# Patient Record
Sex: Female | Born: 2000 | Race: Black or African American | Hispanic: No | Marital: Single | Smoking: Never smoker
Health system: Southern US, Community
[De-identification: ages and names within clinical notes are randomized; demographics above are authoritative.]

## PROBLEM LIST (undated history)

## (undated) DIAGNOSIS — F419 Anxiety disorder, unspecified: Secondary | ICD-10-CM

## (undated) DIAGNOSIS — J45909 Unspecified asthma, uncomplicated: Secondary | ICD-10-CM

## (undated) DIAGNOSIS — F429 Obsessive-compulsive disorder, unspecified: Secondary | ICD-10-CM

## (undated) DIAGNOSIS — L309 Dermatitis, unspecified: Secondary | ICD-10-CM

## (undated) HISTORY — DX: Dermatitis, unspecified: L30.9

---
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
## 2019-01-10 DIAGNOSIS — F419 Anxiety disorder, unspecified: Secondary | ICD-10-CM | POA: Diagnosis not present

## 2019-01-10 DIAGNOSIS — F429 Obsessive-compulsive disorder, unspecified: Secondary | ICD-10-CM | POA: Diagnosis not present

## 2019-01-25 DIAGNOSIS — F419 Anxiety disorder, unspecified: Secondary | ICD-10-CM | POA: Diagnosis not present

## 2019-01-25 DIAGNOSIS — F429 Obsessive-compulsive disorder, unspecified: Secondary | ICD-10-CM | POA: Diagnosis not present

## 2019-02-01 DIAGNOSIS — Z309 Encounter for contraceptive management, unspecified: Secondary | ICD-10-CM | POA: Diagnosis not present

## 2019-02-01 DIAGNOSIS — N946 Dysmenorrhea, unspecified: Secondary | ICD-10-CM | POA: Diagnosis not present

## 2019-02-08 DIAGNOSIS — F419 Anxiety disorder, unspecified: Secondary | ICD-10-CM | POA: Diagnosis not present

## 2019-02-08 DIAGNOSIS — F429 Obsessive-compulsive disorder, unspecified: Secondary | ICD-10-CM | POA: Diagnosis not present

## 2019-02-09 DIAGNOSIS — Z13220 Encounter for screening for lipoid disorders: Secondary | ICD-10-CM | POA: Diagnosis not present

## 2019-02-09 DIAGNOSIS — E559 Vitamin D deficiency, unspecified: Secondary | ICD-10-CM | POA: Diagnosis not present

## 2019-02-09 DIAGNOSIS — Z0000 Encounter for general adult medical examination without abnormal findings: Secondary | ICD-10-CM | POA: Diagnosis not present

## 2019-03-01 DIAGNOSIS — F419 Anxiety disorder, unspecified: Secondary | ICD-10-CM | POA: Diagnosis not present

## 2019-03-01 DIAGNOSIS — F429 Obsessive-compulsive disorder, unspecified: Secondary | ICD-10-CM | POA: Diagnosis not present

## 2019-04-21 DIAGNOSIS — F429 Obsessive-compulsive disorder, unspecified: Secondary | ICD-10-CM | POA: Diagnosis not present

## 2019-04-21 DIAGNOSIS — F419 Anxiety disorder, unspecified: Secondary | ICD-10-CM | POA: Diagnosis not present

## 2019-05-15 DIAGNOSIS — J4530 Mild persistent asthma, uncomplicated: Secondary | ICD-10-CM | POA: Diagnosis not present

## 2019-05-15 DIAGNOSIS — J3089 Other allergic rhinitis: Secondary | ICD-10-CM | POA: Diagnosis not present

## 2019-05-15 DIAGNOSIS — L309 Dermatitis, unspecified: Secondary | ICD-10-CM | POA: Diagnosis not present

## 2019-05-15 DIAGNOSIS — J301 Allergic rhinitis due to pollen: Secondary | ICD-10-CM | POA: Diagnosis not present

## 2019-07-14 ENCOUNTER — Other Ambulatory Visit: Payer: Self-pay

## 2019-07-14 DIAGNOSIS — Z20822 Contact with and (suspected) exposure to covid-19: Secondary | ICD-10-CM

## 2019-07-15 LAB — NOVEL CORONAVIRUS, NAA: SARS-CoV-2, NAA: NOT DETECTED

## 2019-07-20 DIAGNOSIS — F429 Obsessive-compulsive disorder, unspecified: Secondary | ICD-10-CM | POA: Diagnosis not present

## 2019-07-20 DIAGNOSIS — F419 Anxiety disorder, unspecified: Secondary | ICD-10-CM | POA: Diagnosis not present

## 2019-09-04 DIAGNOSIS — Z113 Encounter for screening for infections with a predominantly sexual mode of transmission: Secondary | ICD-10-CM | POA: Diagnosis not present

## 2019-09-04 DIAGNOSIS — Z01419 Encounter for gynecological examination (general) (routine) without abnormal findings: Secondary | ICD-10-CM | POA: Diagnosis not present

## 2019-09-04 DIAGNOSIS — Z681 Body mass index (BMI) 19 or less, adult: Secondary | ICD-10-CM | POA: Diagnosis not present

## 2019-09-04 DIAGNOSIS — Z3040 Encounter for surveillance of contraceptives, unspecified: Secondary | ICD-10-CM | POA: Diagnosis not present

## 2019-09-07 DIAGNOSIS — F429 Obsessive-compulsive disorder, unspecified: Secondary | ICD-10-CM | POA: Diagnosis not present

## 2019-09-07 DIAGNOSIS — J45909 Unspecified asthma, uncomplicated: Secondary | ICD-10-CM | POA: Diagnosis not present

## 2019-09-07 DIAGNOSIS — Z23 Encounter for immunization: Secondary | ICD-10-CM | POA: Diagnosis not present

## 2019-10-27 DIAGNOSIS — F429 Obsessive-compulsive disorder, unspecified: Secondary | ICD-10-CM | POA: Diagnosis not present

## 2019-10-27 DIAGNOSIS — F419 Anxiety disorder, unspecified: Secondary | ICD-10-CM | POA: Diagnosis not present

## 2019-10-30 DIAGNOSIS — L710 Perioral dermatitis: Secondary | ICD-10-CM | POA: Diagnosis not present

## 2019-11-03 ENCOUNTER — Other Ambulatory Visit: Payer: Self-pay

## 2019-11-03 ENCOUNTER — Emergency Department (HOSPITAL_COMMUNITY)
Admission: EM | Admit: 2019-11-03 | Discharge: 2019-11-04 | Disposition: A | Discharge status: Home / Self Care | Payer: Federal, State, Local not specified - PPO | Attending: Emergency Medicine | Admitting: Emergency Medicine

## 2019-11-03 ENCOUNTER — Encounter (HOSPITAL_COMMUNITY): Payer: Self-pay | Admitting: Emergency Medicine

## 2019-11-03 DIAGNOSIS — F429 Obsessive-compulsive disorder, unspecified: Secondary | ICD-10-CM | POA: Diagnosis not present

## 2019-11-03 DIAGNOSIS — F422 Mixed obsessional thoughts and acts: Secondary | ICD-10-CM | POA: Diagnosis not present

## 2019-11-03 DIAGNOSIS — J45909 Unspecified asthma, uncomplicated: Secondary | ICD-10-CM | POA: Diagnosis not present

## 2019-11-03 DIAGNOSIS — F419 Anxiety disorder, unspecified: Secondary | ICD-10-CM | POA: Diagnosis not present

## 2019-11-03 DIAGNOSIS — Z79899 Other long term (current) drug therapy: Secondary | ICD-10-CM | POA: Insufficient documentation

## 2019-11-03 DIAGNOSIS — Z03818 Encounter for observation for suspected exposure to other biological agents ruled out: Secondary | ICD-10-CM | POA: Diagnosis not present

## 2019-11-03 DIAGNOSIS — F411 Generalized anxiety disorder: Secondary | ICD-10-CM

## 2019-11-03 DIAGNOSIS — Z20828 Contact with and (suspected) exposure to other viral communicable diseases: Secondary | ICD-10-CM | POA: Insufficient documentation

## 2019-11-03 NOTE — ED Triage Notes (Addendum)
Patient has a hx of Obsessive compulsive disorder and patient states that she can not rest and feel very anxious. Patient states her md increased her medication but she dont think that it is not working. Patient called md tonight and md told her to come to the ED and be evaluated.

## 2019-11-04 ENCOUNTER — Encounter (HOSPITAL_COMMUNITY): Payer: Self-pay | Admitting: Emergency Medicine

## 2019-11-04 DIAGNOSIS — F429 Obsessive-compulsive disorder, unspecified: Secondary | ICD-10-CM

## 2019-11-04 DIAGNOSIS — F411 Generalized anxiety disorder: Secondary | ICD-10-CM

## 2019-11-04 LAB — I-STAT BETA HCG BLOOD, ED (MC, WL, AP ONLY): I-stat hCG, quantitative: <5 m[IU]/mL (ref <5)

## 2019-11-04 LAB — CBC WITH DIFFERENTIAL/PLATELET
Abs Immature Granulocytes: 0.01 10*3/uL (ref 0.00–0.07)
Basophils Absolute: 0 10*3/uL (ref 0.0–0.1)
Basophils Relative: 0 %
Eosinophils Absolute: 0 10*3/uL (ref 0.0–0.5)
Eosinophils Relative: 0 %
HCT: 40.2 % (ref 36.0–46.0)
Hemoglobin: 13.3 g/dL (ref 12.0–15.0)
Immature Granulocytes: 0 %
Lymphocytes Relative: 55 %
Lymphs Abs: 3.3 10*3/uL (ref 0.7–4.0)
MCH: 31.1 pg (ref 26.0–34.0)
MCHC: 33.1 g/dL (ref 30.0–36.0)
MCV: 94.1 fL (ref 80.0–100.0)
Monocytes Absolute: 0.6 10*3/uL (ref 0.1–1.0)
Monocytes Relative: 10 %
Neutro Abs: 2.2 10*3/uL (ref 1.7–7.7)
Neutrophils Relative %: 35 %
Platelets: 294 10*3/uL (ref 150–400)
RBC: 4.27 MIL/uL (ref 3.87–5.11)
RDW: 11.2 % — ABNORMAL LOW (ref 11.5–15.5)
WBC: 6.1 10*3/uL (ref 4.0–10.5)
nRBC: 0 % (ref 0.0–0.2)

## 2019-11-04 LAB — BASIC METABOLIC PANEL
Anion gap: 10 (ref 5–15)
BUN: 13 mg/dL (ref 6–20)
CO2: 21 mmol/L — ABNORMAL LOW (ref 22–32)
Calcium: 9.2 mg/dL (ref 8.9–10.3)
Chloride: 106 mmol/L (ref 98–111)
Creatinine, Ser: 0.73 mg/dL (ref 0.44–1.00)
GFR calc Af Amer: >60 mL/min (ref >60)
GFR calc non Af Amer: >60 mL/min (ref >60)
Glucose, Bld: 77 mg/dL (ref 70–99)
Potassium: 3.7 mmol/L (ref 3.5–5.1)
Sodium: 137 mmol/L (ref 135–145)

## 2019-11-04 LAB — SARS CORONAVIRUS 2 (TAT 6-24 HRS): SARS Coronavirus 2: NEGATIVE

## 2019-11-04 MED ORDER — ALUM & MAG HYDROXIDE-SIMETH 200-200-20 MG/5ML PO SUSP: 30.0000 mL | Freq: Four times a day (QID) | ORAL | Status: DC | PRN

## 2019-11-04 MED ORDER — ERYTHROMYCIN 2 % EX GEL: 1.0000 g | Freq: Two times a day (BID) | CUTANEOUS | Status: DC

## 2019-11-04 MED ORDER — FLUVOXAMINE MALEATE 50 MG PO TABS: 100.0000 mg | ORAL_TABLET | Freq: Two times a day (BID) | ORAL | 0 refills | Status: AC

## 2019-11-04 MED ORDER — HYDROXYZINE HCL 10 MG PO TABS: 10.0000 mg | ORAL_TABLET | Freq: Three times a day (TID) | ORAL | 0 refills | Status: AC | PRN

## 2019-11-04 MED ORDER — FLUTICASONE PROPIONATE HFA 44 MCG/ACT IN AERO: 1.0000 | INHALATION_SPRAY | Freq: Two times a day (BID) | RESPIRATORY_TRACT | Status: DC

## 2019-11-04 MED ORDER — NORGESTIMATE-ETH ESTRADIOL 0.25-35 MG-MCG PO TABS: 1.0000 | ORAL_TABLET | Freq: Every day | ORAL | Status: DC

## 2019-11-04 MED ORDER — CLOMIPRAMINE HCL 25 MG PO CAPS
25.0000 mg | ORAL_CAPSULE | Freq: Every day | ORAL | Status: DC
  Filled 2019-11-04: qty 1

## 2019-11-04 MED ORDER — HYDROXYZINE HCL 10 MG PO TABS: 10.0000 mg | ORAL_TABLET | Freq: Three times a day (TID) | ORAL | Status: DC

## 2019-11-04 MED ORDER — LEVOCETIRIZINE DIHYDROCHLORIDE 5 MG PO TABS: 5.0000 mg | ORAL_TABLET | Freq: Every day | ORAL | Status: DC

## 2019-11-04 MED ORDER — FLUTICASONE PROPIONATE 50 MCG/ACT NA SUSP
2.0000 | Freq: Every day | NASAL | Status: DC
  Filled 2019-11-04: qty 16

## 2019-11-04 MED ORDER — MONTELUKAST SODIUM 10 MG PO TABS: 10.0000 mg | ORAL_TABLET | Freq: Every day | ORAL | Status: DC

## 2019-11-04 NOTE — ED Notes (Signed)
Sheila Woodward with TTS is at patient bedside.

## 2019-11-04 NOTE — Consult Note (Addendum)
  Patient seen and case discussed with Dr. Darleene Cleaver. Patient has been psych cleared. Will discharge home with mother, who was present at bedside. Will prescribe Hydroxyzine 10mg  po TID prn for anxiety. This rx has been scribed to [facility] on [address]. Patient and mother are both aware of new changes, and verbalize understanding. Patient is open to DBT and will receive a referral to [facility] for DBT services via her outpatient psychiatrist. She denies si/hi/avh at this time. She does not appear to be responding to internal stimuli. There was some concerns about elevated blood pressure readings, however suspect this maybe due to adrenaline response and hyperventilation. BP readings are stable. Patient has history of hyperandrogenism and can follow up with PCP if BP readings continue to be elevated once all efforts to reduce anxiety have been exhausted.   Patient seen face-to-face for psychiatric evaluation, chart reviewed and case discussed with the physician extender and developed treatment plan. Reviewed the information documented and agree with the treatment plan. [name], MD

## 2019-11-04 NOTE — ED Notes (Signed)
Mother in room " states something seems off, and her daughters having worst OCD and racing thoughts, and then a need to " confess his actions". Mother called her psych doctor Akintayo, whom has been tampering her fluvoxamine dosages up from 100mg ,  to 150 mg, to 200mg  twice a day. most recently.  Mother was feels like she really need some medical help with her anxiety/ OTC behavior.  Pt was taking clomipramine 25 mg once a day. But did not like the metallic taste.  Was diagnosed with OTC , in the year 2011. [place]

## 2019-11-04 NOTE — ED Notes (Signed)
ekg snapped

## 2019-11-04 NOTE — ED Notes (Signed)
Sheila Woodward with TTS is at patient bedside with patient and caregiver at this time.

## 2019-11-04 NOTE — ED Provider Notes (Addendum)
[facility] DEPT Provider Note   CSN: [id number] Arrival date & time: 11/03/19  2151     History   Chief Complaint Chief Complaint  Patient presents with  . Anxiety    HPI Sheila Woodward is a 18 y.o. female.     The history is provided by the patient and a relative.  Anxiety This is a new problem. The current episode started more than 1 week ago. The problem occurs constantly. The problem has not changed since onset.Pertinent negatives include no chest pain, no abdominal pain, no headaches and no shortness of breath. Nothing aggravates the symptoms. Nothing relieves the symptoms. Treatments tried: home medications. The treatment provided no relief.  Patient with OCD presents with anxiety and was told to come in to be seen by psychiatry for her anxiety as they do not want to keep changing her medications they want an evaluation.    Past Medical History:  Diagnosis Date  . Anxiety   . Asthma   . Eczema     Patient Active Problem List   Diagnosis Date Noted  . Vaginal irritation 06/02/2013  . 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 No pertinent surgical history.   OB History   No obstetric history on file.      Home Medications    Prior to Admission medications   Medication Sig Start Date End Date Taking? Authorizing Provider  clomiPRAMINE (ANAFRANIL) 25 MG capsule Take 1 capsule by mouth daily. 07/03/19  Yes [provider]  erythromycin with ethanol (EMGEL) 2 % gel Apply 1 g topically 2 (two) times daily. 10/30/19  Yes [provider]  ESTARYLLA 0.25-35 MG-MCG tablet Take 1 tablet by mouth daily. 09/04/19  Yes [provider]  FLOVENT HFA 44 MCG/ACT inhaler Take 1 puff by mouth 2 (two) times daily. 10/29/19  Yes [provider]  levocetirizine (XYZAL) 5 MG tablet Take 1 tablet by mouth daily. 10/29/19  Yes [provider]  fluconazole (DIFLUCAN) 150 MG tablet Take 1 tablet  (150 mg total) by mouth once. 06/02/13   Gaspar Skeeters, MD  fluticasone Asencion Islam) 50 MCG/ACT nasal spray  05/26/13   [provider]  fluvoxaMINE (LUVOX) 50 MG tablet  05/15/13   [provider]  montelukast (SINGULAIR) 10 MG tablet  05/17/13   [provider]    Family History Family History  Problem Relation Age of Onset  . Endometriosis Mother   . Endometriosis Maternal Grandmother     Social History Social History   Tobacco Use  . Smoking status: Never Smoker  . Smokeless tobacco: Never Used  Substance Use Topics  . Alcohol use: No  . Drug use: No     Allergies   Patient has no known allergies.   Review of Systems Review of Systems  Constitutional: Negative for fever.  Eyes: Negative for visual disturbance.  Respiratory: Negative for shortness of breath.   Cardiovascular: Negative for chest pain.  Gastrointestinal: Negative for abdominal pain.  Genitourinary: Negative for difficulty urinating.  Musculoskeletal: Negative for arthralgias.  Neurological: Negative for headaches.  Psychiatric/Behavioral: The patient is nervous/anxious.   All other systems reviewed and are negative.    Physical Exam Updated Vital Signs BP 111/75 (BP Location: Left Arm)   Pulse 63   Temp 98.1 F (36.7 C) (Oral)   Resp 15   Ht 5\' 3"  (1.6 m)   Wt 59 kg   LMP 10/27/2019   SpO2 100%   BMI 23.03  kg/m   Physical Exam Vitals signs and nursing note reviewed.  Constitutional:      General: She is not in acute distress.    Appearance: Normal appearance. She is normal weight.  HENT:     Head: Normocephalic and atraumatic.     Nose: Nose normal.  Eyes:     Conjunctiva/sclera: Conjunctivae normal.     Pupils: Pupils are equal, round, and reactive to light.  Neck:     Musculoskeletal: Normal range of motion and neck supple.  Cardiovascular:     Rate and Rhythm: Normal rate and regular rhythm.     Pulses: Normal pulses.     Heart sounds: Normal heart  sounds.  Pulmonary:     Effort: Pulmonary effort is normal.     Breath sounds: Normal breath sounds.  Abdominal:     General: Abdomen is flat. Bowel sounds are normal.     Tenderness: There is no abdominal tenderness. There is no guarding.  Musculoskeletal: Normal range of motion.  Skin:    General: Skin is warm and dry.     Capillary Refill: Capillary refill takes less than 2 seconds.  Neurological:     General: No focal deficit present.     Mental Status: She is alert and oriented to person, place, and time.     Deep Tendon Reflexes: Reflexes normal.  Psychiatric:        Thought Content: Thought content normal.      ED Treatments / Results   Vitals:   11/04/19 0637 11/04/19 0643  BP: 111/75   Pulse: 63   Resp: 15   Temp: 98.1 F (36.7 C)   SpO2: 100% 100%    Labs (all labs ordered are listed, but only abnormal results are displayed) Results for orders placed or performed during the hospital encounter of 11/03/19  CBC with Differential  Result Value Ref Range   WBC 6.1 4.0 - 10.5 K/uL   RBC 4.27 3.87 - 5.11 MIL/uL   Hemoglobin 13.3 12.0 - 15.0 g/dL   HCT 98.140.2 19.136.0 - 47.846.0 %   MCV 94.1 80.0 - 100.0 fL   MCH 31.1 26.0 - 34.0 pg   MCHC 33.1 30.0 - 36.0 g/dL   RDW 29.511.2 (L) 62.111.5 - 30.815.5 %   Platelets 294 150 - 400 K/uL   nRBC 0.0 0.0 - 0.2 %   Neutrophils Relative % 35 %   Neutro Abs 2.2 1.7 - 7.7 K/uL   Lymphocytes Relative 55 %   Lymphs Abs 3.3 0.7 - 4.0 K/uL   Monocytes Relative 10 %   Monocytes Absolute 0.6 0.1 - 1.0 K/uL   Eosinophils Relative 0 %   Eosinophils Absolute 0.0 0.0 - 0.5 K/uL   Basophils Relative 0 %   Basophils Absolute 0.0 0.0 - 0.1 K/uL   Immature Granulocytes 0 %   Abs Immature Granulocytes 0.01 0.00 - 0.07 K/uL  Basic metabolic panel  Result Value Ref Range   Sodium 137 135 - 145 mmol/L   Potassium 3.7 3.5 - 5.1 mmol/L   Chloride 106 98 - 111 mmol/L   CO2 21 (L) 22 - 32 mmol/L   Glucose, Bld 77 70 - 99 mg/dL   BUN 13 6 - 20 mg/dL    Creatinine, Ser 6.570.73 0.44 - 1.00 mg/dL   Calcium 9.2 8.9 - 84.610.3 mg/dL   GFR calc non Af Amer >60 >60 mL/min   GFR calc Af Amer >60 >60 mL/min   Anion gap 10 5 -  15  I-Stat Beta hCG blood, ED ([facility], [facility], [facility] only)  Result Value Ref Range   I-stat hCG, quantitative <5.0 <5 mIU/mL   Comment 3           No results found. EKG None  Radiology No results found.   Medications Medications  alum & mag hydroxide-simeth (MAALOX/MYLANTA) 200-200-20 MG/5ML suspension 30 mL (has no administration in time range)  clomiPRAMINE (ANAFRANIL) capsule 25 mg (has no administration in time range)  erythromycin with ethanol (EMGEL) 2 % gel 1 g (has no administration in time range)  fluticasone (FLONASE) 50 MCG/ACT nasal spray 2 spray (has no administration in time range)  montelukast (SINGULAIR) tablet 10 mg (has no administration in time range)  norgestimate-ethinyl estradiol (ORTHO-CYCLEN) 0.25-35 MG-MCG tablet 1 tablet (has no administration in time range)  fluticasone (FLOVENT HFA) 44 MCG/ACT inhaler 1 puff (1 puff Inhalation Not Given 11/04/19 0723)  levocetirizine (XYZAL) tablet 5 mg (has no administration in time range)     Initial Impression / Assessment and Plan / ED Course    DORLIS JUDICE was evaluated in Emergency Department on 11/04/2019 for the symptoms described in the history of present illness. She was evaluated in the context of the global COVID-19 pandemic, which necessitated consideration that the patient might be at risk for infection with the SARS-CoV-2 virus that causes COVID-19. Institutional protocols and algorithms that pertain to the evaluation of patients at risk for COVID-19 are in a state of rapid change based on information released by regulatory bodies including the CDC and federal and state organizations. These policies and algorithms were followed during the patient's care in the ED.   Final Clinical Impressions(s) / ED Diagnoses   Final diagnoses:  Anxiety    Patient is medically stable and cleared by me for TTS.  Consultation is ongoing at this time.  Covid swabs ordered.  Holding orders placed in Epic.  Disposition per psychiatry.        Jaimeson Gopal, MD 11/04/19 [phone]

## 2019-11-04 NOTE — BH Assessment (Signed)
Assessment Note  Sheila BERCH is an 18 y.o. female  That presents this date voluntary after having an exacerbation of her anxiety disorder which has triggered her OCD. Patient denies any S/I, H/I or AVH. Patient says she is hyper focused on her sexuality. Patient denies any prior attempts or gestures at self harm. Patient denies history of abuse. Patient's mother who is present provides collateral stating the patient over the last week has been  emotional and very self critical. Mother states patient has been "confessing certain intimate acts" associated with her current partner. Patient states she was diagnosed with anxiety and OCD in 2011 and is currently receiving OP services from [facility] MD who assists with medication management. Patient reports current compliance and states she has had recent medication charges that occurred two weeks ago by provider. Patient states her fluvoxamine dosages have been increased up from 100mg , to 150 mg, to 200mg  twice a day. Patient is currently in her second year at [employer] and resides off campus. Patient states her thoughts became so overwhelming and intrusive last night she presented this date for evaluation. Patient will be seen by Akintayo MD later this date to evaluate and make treatment recommendations. Patient denies any SA history. Patient is very insightful in reference to her current condition. Patient's speech was coherent with normal tone and volume. Patient's mood was pleasant  and affect congruent with mood. Patient's motor activity was normal.Patient's judgment and insight are good. Patient did not appear to be responding to internal stimuli. Case was staffed with Akintayo MD who recommended patient be discharged later this date.   Diagnosis: OCD  Past Medical History:  Past Medical History:  Diagnosis Date  . Anxiety   . Asthma   . Eczema     History reviewed. No pertinent surgical history.  Family History:  Family History  Problem  Relation Age of Onset  . Endometriosis Mother   . Endometriosis Maternal Grandmother     Social History:  reports that she has never smoked. She has never used smokeless tobacco. She reports that she does not drink alcohol or use drugs.  Additional Social History:  Alcohol / Drug Use Pain Medications: See MAR Prescriptions: See MAR Over the Counter: See MAR History of alcohol / drug use?: No history of alcohol / drug abuse  CIWA: CIWA-Ar BP: 128/78 Pulse Rate: 72 COWS:    Allergies: No Known Allergies  Home Medications: (Not in a hospital admission)   OB/GYN Status:  Patient's last menstrual period was 10/27/2019.  General Assessment Data Location of Assessment: [facility] TTS Assessment: In system Is this a Tele or Face-to-Face Assessment?: Face-to-Face Is this an Initial Assessment or a Re-assessment for this encounter?: Initial Assessment Patient Accompanied by:: N/A Language Other than English: No Living Arrangements: Other (Comment) What gender do you identify as?: Female Marital status: Single Pregnancy Status: No Living Arrangements: Non-relatives/Friends Can pt return to current living arrangement?: Yes Admission Status: Voluntary Is patient capable of signing voluntary admission?: Yes Referral Source: Self/Family/Friend Insurance type: BCBS  Medical Screening Exam [facility] Walk-in ONLY) Medical Exam completed: Yes  Crisis Care Plan Living Arrangements: Non-relatives/Friends Legal Guardian: (NA) Name of Psychiatrist: Akintayo  Name of Therapist: None  Education Status Is patient currently in school?: Yes Current Grade: Freshman Highest grade of school patient has completed: 12 Name of school: [phone] person: NA IEP information if applicable: NA  Risk to self with the past 6 months Suicidal Ideation: No Has patient been a risk to  self within the past 6 months prior to admission? : No Suicidal Intent: No Has patient had any suicidal intent within  the past 6 months prior to admission? : No Is patient at risk for suicide?: No, but patient needs Medical Clearance Suicidal Plan?: No Has patient had any suicidal plan within the past 6 months prior to admission? : No Access to Means: No What has been your use of drugs/alcohol within the last 12 months?: NA Previous Attempts/Gestures: No How many times?: 0 Other Self Harm Risks: (NA) Triggers for Past Attempts: (NA) Intentional Self Injurious Behavior: None Family Suicide History: No Recent stressful life event(s): Other (Comment)(Stress from school) Persecutory voices/beliefs?: No Depression: No Depression Symptoms: (NA) Substance abuse history and/or treatment for substance abuse?: No Suicide prevention information given to non-admitted patients: Not applicable  Risk to Others within the past 6 months Homicidal Ideation: No Does patient have any lifetime risk of violence toward others beyond the six months prior to admission? : No Thoughts of Harm to Others: No Current Homicidal Intent: No Current Homicidal Plan: No Access to Homicidal Means: No Identified Victim: NA History of harm to others?: No Assessment of Violence: None Noted Violent Behavior Description: NA Does patient have access to weapons?: No Criminal Charges Pending?: No Does patient have a court date: No Is patient on probation?: No  Psychosis Hallucinations: None noted Delusions: None noted  Mental Status Report Appearance/Hygiene: Unremarkable Eye Contact: Fair Motor Activity: Freedom of movement Speech: Logical/coherent Level of Consciousness: Alert Mood: Pleasant Affect: Appropriate to circumstance Anxiety Level: Minimal Thought Processes: Coherent, Relevant Judgement: Partial Orientation: Person, Place, Time Obsessive Compulsive Thoughts/Behaviors: None  Cognitive Functioning Concentration: Normal Memory: Recent Intact, Remote Intact Is patient IDD: No Insight: Good Impulse Control:  Good Appetite: Good Have you had any weight changes? : No Change Sleep: No Change Total Hours of Sleep: 7 Vegetative Symptoms: None  ADLScreening [facility] Assessment Services) Patient's cognitive ability adequate to safely complete daily activities?: Yes Patient able to express need for assistance with ADLs?: Yes Independently performs ADLs?: Yes (appropriate for developmental age)  Prior Inpatient Therapy Prior Inpatient Therapy: No  Prior Outpatient Therapy Prior Outpatient Therapy: Yes Prior Therapy Dates: Ongoing Prior Therapy Facilty/Provider(s): Akintayo MD Reason for Treatment: [facility] Does patient have an ACCT team?: No Does patient have Intensive In-House Services?  : No Does patient have [facility] services? : No Does patient have P4CC services?: No  ADL Screening (condition at time of admission) Patient's cognitive ability adequate to safely complete daily activities?: Yes Is the patient deaf or have difficulty hearing?: No Does the patient have difficulty seeing, even when wearing glasses/contacts?: No Does the patient have difficulty concentrating, remembering, or making decisions?: No Patient able to express need for assistance with ADLs?: Yes Does the patient have difficulty dressing or bathing?: No Independently performs ADLs?: Yes (appropriate for developmental age) Does the patient have difficulty walking or climbing stairs?: No Weakness of Legs: None Weakness of Arms/Hands: None  Home Assistive Devices/Equipment Home Assistive Devices/Equipment: None  Therapy Consults (therapy consults require a physician order) PT Evaluation Needed: No OT Evalulation Needed: No SLP Evaluation Needed: No Abuse/Neglect Assessment (Assessment to be complete while patient is alone) Abuse/Neglect Assessment Can Be Completed: Yes Physical Abuse: Denies Verbal Abuse: Denies Sexual Abuse: Denies Exploitation of patient/patient's resources: Denies Self-Neglect: Denies Values /  Beliefs Cultural Requests During Hospitalization: None Spiritual Requests During Hospitalization: None Consults Spiritual Care Consult Needed: No Social Work Consult Needed: No Regulatory affairs officer (For Healthcare) Does  Patient Have a Medical Advance Directive?: No Would patient like information on creating a medical advance directive?: No - Patient declined          Disposition: Case was staffed with Akintayo MD who recommended patient be discharged later this date.    Disposition Initial Assessment Completed for this Encounter: Yes Disposition of Patient: Discharge  On Site Evaluation by:   Reviewed with Physician:    Alfredia Fergusonavid L Noam Franzen 11/04/2019 9:49 AM

## 2019-11-04 NOTE — ED Notes (Signed)
Pt provided with breakfast tray.

## 2019-11-04 NOTE — BH Assessment (Signed)
[facility] Assessment Progress Note  Case was staffed with Akintayo MD who recommended patient be discharged later this date.

## 2019-11-27 DIAGNOSIS — F429 Obsessive-compulsive disorder, unspecified: Secondary | ICD-10-CM | POA: Diagnosis not present

## 2019-11-27 DIAGNOSIS — F419 Anxiety disorder, unspecified: Secondary | ICD-10-CM | POA: Diagnosis not present

## 2019-12-22 DIAGNOSIS — F419 Anxiety disorder, unspecified: Secondary | ICD-10-CM | POA: Diagnosis not present

## 2019-12-22 DIAGNOSIS — F429 Obsessive-compulsive disorder, unspecified: Secondary | ICD-10-CM | POA: Diagnosis not present

## 2020-01-19 DIAGNOSIS — Z20822 Contact with and (suspected) exposure to covid-19: Secondary | ICD-10-CM | POA: Diagnosis not present

## 2020-01-19 DIAGNOSIS — Z20828 Contact with and (suspected) exposure to other viral communicable diseases: Secondary | ICD-10-CM | POA: Diagnosis not present

## 2020-02-01 DIAGNOSIS — F419 Anxiety disorder, unspecified: Secondary | ICD-10-CM | POA: Diagnosis not present

## 2020-02-01 DIAGNOSIS — F429 Obsessive-compulsive disorder, unspecified: Secondary | ICD-10-CM | POA: Diagnosis not present

## 2020-02-16 DIAGNOSIS — N898 Other specified noninflammatory disorders of vagina: Secondary | ICD-10-CM | POA: Diagnosis not present

## 2020-02-16 DIAGNOSIS — Z113 Encounter for screening for infections with a predominantly sexual mode of transmission: Secondary | ICD-10-CM | POA: Diagnosis not present

## 2020-02-16 DIAGNOSIS — N6012 Diffuse cystic mastopathy of left breast: Secondary | ICD-10-CM | POA: Diagnosis not present

## 2020-02-16 DIAGNOSIS — N6011 Diffuse cystic mastopathy of right breast: Secondary | ICD-10-CM | POA: Diagnosis not present

## 2020-02-19 DIAGNOSIS — F419 Anxiety disorder, unspecified: Secondary | ICD-10-CM | POA: Diagnosis not present

## 2020-02-19 DIAGNOSIS — F429 Obsessive-compulsive disorder, unspecified: Secondary | ICD-10-CM | POA: Diagnosis not present

## 2020-03-14 ENCOUNTER — Ambulatory Visit: Payer: Federal, State, Local not specified - PPO | Attending: Family

## 2020-03-14 DIAGNOSIS — Z23 Encounter for immunization: Secondary | ICD-10-CM

## 2020-03-14 NOTE — Progress Notes (Signed)
   Covid-19 Vaccination Clinic  Name:  Sheila Woodward    MRN: [id number] DOB: 06/29/2001  03/14/2020  [name] was observed post Covid-19 immunization for 15 minutes without incident. She was provided with Vaccine Information Sheet and instruction to access the V-Safe system.   [name] was instructed to call 911 with any severe reactions post vaccine: Marland Kitchen Difficulty breathing  . Swelling of face and throat  . A fast heartbeat  . A bad rash all over body  . Dizziness and weakness   Immunizations Administered    Name Date Dose VIS Date Route   Moderna COVID-19 Vaccine 03/14/2020 11:44 AM 0.5 mL 11/07/2019 Intramuscular   Manufacturer: Moderna   Lot: [id number]   NDC: 16384-665-99

## 2020-03-18 DIAGNOSIS — F419 Anxiety disorder, unspecified: Secondary | ICD-10-CM | POA: Diagnosis not present

## 2020-03-18 DIAGNOSIS — F429 Obsessive-compulsive disorder, unspecified: Secondary | ICD-10-CM | POA: Diagnosis not present

## 2020-04-09 ENCOUNTER — Ambulatory Visit: Payer: Federal, State, Local not specified - PPO | Attending: Family

## 2020-04-09 DIAGNOSIS — Z23 Encounter for immunization: Secondary | ICD-10-CM

## 2020-04-09 NOTE — Progress Notes (Signed)
   Covid-19 Vaccination Clinic  Name:  BLANDINA RENALDO    MRN: [id number] DOB: 2001-08-07  04/09/2020  Ms. Chretien was observed post Covid-19 immunization for 15 minutes without incident. She was provided with Vaccine Information Sheet and instruction to access the V-Safe system.   Ms. Riordan was instructed to call 911 with any severe reactions post vaccine: Marland Kitchen Difficulty breathing  . Swelling of face and throat  . A fast heartbeat  . A bad rash all over body  . Dizziness and weakness   Immunizations Administered    Name Date Dose VIS Date Route   Moderna COVID-19 Vaccine 04/09/2020 10:21 AM 0.5 mL 11/2019 Intramuscular   Manufacturer: Moderna   Lot: [id number]   NDC: 28413-244-01

## 2020-04-12 DIAGNOSIS — F419 Anxiety disorder, unspecified: Secondary | ICD-10-CM | POA: Diagnosis not present

## 2020-04-12 DIAGNOSIS — F429 Obsessive-compulsive disorder, unspecified: Secondary | ICD-10-CM | POA: Diagnosis not present

## 2020-04-17 DIAGNOSIS — F429 Obsessive-compulsive disorder, unspecified: Secondary | ICD-10-CM | POA: Diagnosis not present

## 2020-04-17 DIAGNOSIS — F419 Anxiety disorder, unspecified: Secondary | ICD-10-CM | POA: Diagnosis not present

## 2020-05-10 DIAGNOSIS — F429 Obsessive-compulsive disorder, unspecified: Secondary | ICD-10-CM | POA: Diagnosis not present

## 2020-05-10 DIAGNOSIS — F419 Anxiety disorder, unspecified: Secondary | ICD-10-CM | POA: Diagnosis not present

## 2020-05-17 DIAGNOSIS — L309 Dermatitis, unspecified: Secondary | ICD-10-CM | POA: Diagnosis not present

## 2020-05-17 DIAGNOSIS — J4530 Mild persistent asthma, uncomplicated: Secondary | ICD-10-CM | POA: Diagnosis not present

## 2020-05-17 DIAGNOSIS — J301 Allergic rhinitis due to pollen: Secondary | ICD-10-CM | POA: Diagnosis not present

## 2020-05-17 DIAGNOSIS — J3089 Other allergic rhinitis: Secondary | ICD-10-CM | POA: Diagnosis not present

## 2020-06-17 DIAGNOSIS — F419 Anxiety disorder, unspecified: Secondary | ICD-10-CM | POA: Diagnosis not present

## 2020-06-17 DIAGNOSIS — F429 Obsessive-compulsive disorder, unspecified: Secondary | ICD-10-CM | POA: Diagnosis not present

## 2020-07-23 DIAGNOSIS — H1033 Unspecified acute conjunctivitis, bilateral: Secondary | ICD-10-CM | POA: Diagnosis not present

## 2020-07-24 DIAGNOSIS — H1031 Unspecified acute conjunctivitis, right eye: Secondary | ICD-10-CM | POA: Diagnosis not present

## 2020-07-24 DIAGNOSIS — T7840XA Allergy, unspecified, initial encounter: Secondary | ICD-10-CM | POA: Diagnosis not present

## 2020-07-25 ENCOUNTER — Emergency Department (HOSPITAL_BASED_OUTPATIENT_CLINIC_OR_DEPARTMENT_OTHER)
Admission: EM | Admit: 2020-07-25 | Discharge: 2020-07-25 | Disposition: A | Discharge status: Home / Self Care | Payer: Federal, State, Local not specified - PPO | Attending: Emergency Medicine | Admitting: Emergency Medicine

## 2020-07-25 ENCOUNTER — Other Ambulatory Visit: Payer: Self-pay

## 2020-07-25 ENCOUNTER — Encounter (HOSPITAL_BASED_OUTPATIENT_CLINIC_OR_DEPARTMENT_OTHER): Payer: Self-pay | Admitting: Emergency Medicine

## 2020-07-25 DIAGNOSIS — Z7951 Long term (current) use of inhaled steroids: Secondary | ICD-10-CM | POA: Insufficient documentation

## 2020-07-25 DIAGNOSIS — Z79899 Other long term (current) drug therapy: Secondary | ICD-10-CM | POA: Diagnosis not present

## 2020-07-25 DIAGNOSIS — H109 Unspecified conjunctivitis: Secondary | ICD-10-CM | POA: Diagnosis not present

## 2020-07-25 DIAGNOSIS — H53143 Visual discomfort, bilateral: Secondary | ICD-10-CM | POA: Diagnosis not present

## 2020-07-25 DIAGNOSIS — H538 Other visual disturbances: Secondary | ICD-10-CM | POA: Diagnosis not present

## 2020-07-25 DIAGNOSIS — J45909 Unspecified asthma, uncomplicated: Secondary | ICD-10-CM | POA: Diagnosis not present

## 2020-07-25 DIAGNOSIS — H5713 Ocular pain, bilateral: Secondary | ICD-10-CM | POA: Diagnosis not present

## 2020-07-25 HISTORY — DX: Obsessive-compulsive disorder, unspecified: F42.9

## 2020-07-25 MED ORDER — TETRACAINE HCL 0.5 % OP SOLN
1.0000 [drp] | Freq: Once | OPHTHALMIC | Status: AC
  Administered 2020-07-25: 1 [drp] via OPHTHALMIC
  Filled 2020-07-25: qty 4

## 2020-07-25 MED ORDER — ERYTHROMYCIN 5 MG/GM OP OINT: TOPICAL_OINTMENT | OPHTHALMIC | 0 refills | Status: AC

## 2020-07-25 MED ORDER — FLUORESCEIN SODIUM 1 MG OP STRP
1.0000 | ORAL_STRIP | Freq: Once | OPHTHALMIC | Status: AC
  Administered 2020-07-25: 1 via OPHTHALMIC
  Filled 2020-07-25: qty 1

## 2020-07-25 MED ORDER — ARTIFICIAL TEARS OPHTHALMIC OINT: TOPICAL_OINTMENT | OPHTHALMIC | 0 refills | Status: AC | PRN

## 2020-07-25 NOTE — Discharge Instructions (Signed)
Follow these instructions at home: Avoid known allergens whenever possible. Take or apply over-the-counter and prescription medicines only as told by your health care provider. These include any eye drops. Apply a cool, clean washcloth to your eye for 10-20 minutes, 3-4 times a day. Do not touch or rub your eyes. Do not wear contact lenses until the inflammation is gone. Wear glasses instead. Do not wear eye makeup until the inflammation is gone. Keep all follow-up visits as told by your health care provider. This is important. Contact a health care provider if: Your symptoms get worse or do not improve with treatment. You have mild eye pain. You have sensitivity to light. You have spots or blisters on your eyes. You have pus draining from your eye. You have a fever.

## 2020-07-25 NOTE — ED Notes (Signed)
Mother signed pt d/c as pt unable to see r/t eye issue. Pt agrees and verbalizes DC teaching and Rx.
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
## 2020-07-25 NOTE — ED Triage Notes (Signed)
Diagnosed with pink eye 2 days ago.  She had allergic reaction to the eye drops.  Went to [facility] and they gave her eye drops without sulfa (Tobramycin)  and a steroid inj yesterday morning. The pain is good but today she is having blurry vision and having trouble focusing.  Eyes watering and having photo sensitivity.  Called [facility] and they sent her to ED for eval.

## 2020-07-26 DIAGNOSIS — H1033 Unspecified acute conjunctivitis, bilateral: Secondary | ICD-10-CM | POA: Diagnosis not present

## 2020-08-14 DIAGNOSIS — F429 Obsessive-compulsive disorder, unspecified: Secondary | ICD-10-CM | POA: Diagnosis not present

## 2020-08-14 DIAGNOSIS — F419 Anxiety disorder, unspecified: Secondary | ICD-10-CM | POA: Diagnosis not present

## 2020-09-03 DIAGNOSIS — R05 Cough: Secondary | ICD-10-CM | POA: Diagnosis not present

## 2020-09-03 DIAGNOSIS — Z20822 Contact with and (suspected) exposure to covid-19: Secondary | ICD-10-CM | POA: Diagnosis not present

## 2020-09-03 DIAGNOSIS — R0981 Nasal congestion: Secondary | ICD-10-CM | POA: Diagnosis not present

## 2020-09-11 DIAGNOSIS — S50312A Abrasion of left elbow, initial encounter: Secondary | ICD-10-CM | POA: Diagnosis not present

## 2020-09-11 DIAGNOSIS — M79639 Pain in unspecified forearm: Secondary | ICD-10-CM | POA: Diagnosis not present

## 2020-09-11 DIAGNOSIS — S50311A Abrasion of right elbow, initial encounter: Secondary | ICD-10-CM | POA: Diagnosis not present

## 2020-09-11 DIAGNOSIS — S80212A Abrasion, left knee, initial encounter: Secondary | ICD-10-CM | POA: Diagnosis not present

## 2020-09-11 DIAGNOSIS — R21 Rash and other nonspecific skin eruption: Secondary | ICD-10-CM | POA: Diagnosis not present

## 2020-09-11 DIAGNOSIS — L539 Erythematous condition, unspecified: Secondary | ICD-10-CM | POA: Diagnosis not present

## 2020-09-11 DIAGNOSIS — R600 Localized edema: Secondary | ICD-10-CM | POA: Diagnosis not present

## 2020-09-11 DIAGNOSIS — Z041 Encounter for examination and observation following transport accident: Secondary | ICD-10-CM | POA: Diagnosis not present

## 2020-09-11 DIAGNOSIS — M79669 Pain in unspecified lower leg: Secondary | ICD-10-CM | POA: Diagnosis not present

## 2020-09-24 DIAGNOSIS — Z6823 Body mass index (BMI) 23.0-23.9, adult: Secondary | ICD-10-CM | POA: Diagnosis not present

## 2020-09-24 DIAGNOSIS — L853 Xerosis cutis: Secondary | ICD-10-CM | POA: Diagnosis not present

## 2020-09-24 DIAGNOSIS — Z113 Encounter for screening for infections with a predominantly sexual mode of transmission: Secondary | ICD-10-CM | POA: Diagnosis not present

## 2020-09-24 DIAGNOSIS — Z01411 Encounter for gynecological examination (general) (routine) with abnormal findings: Secondary | ICD-10-CM | POA: Diagnosis not present

## 2020-12-11 DIAGNOSIS — F411 Generalized anxiety disorder: Secondary | ICD-10-CM | POA: Diagnosis not present

## 2020-12-11 DIAGNOSIS — Z79891 Long term (current) use of opiate analgesic: Secondary | ICD-10-CM | POA: Diagnosis not present

## 2021-01-07 DIAGNOSIS — F411 Generalized anxiety disorder: Secondary | ICD-10-CM | POA: Diagnosis not present

## 2021-01-20 DIAGNOSIS — F411 Generalized anxiety disorder: Secondary | ICD-10-CM | POA: Diagnosis not present

## 2021-02-06 DIAGNOSIS — F411 Generalized anxiety disorder: Secondary | ICD-10-CM | POA: Diagnosis not present

## 2021-03-06 DIAGNOSIS — F411 Generalized anxiety disorder: Secondary | ICD-10-CM | POA: Diagnosis not present

## 2021-04-22 DIAGNOSIS — F411 Generalized anxiety disorder: Secondary | ICD-10-CM | POA: Diagnosis not present

## 2021-07-02 DIAGNOSIS — L309 Dermatitis, unspecified: Secondary | ICD-10-CM | POA: Diagnosis not present

## 2021-07-02 DIAGNOSIS — J3089 Other allergic rhinitis: Secondary | ICD-10-CM | POA: Diagnosis not present

## 2021-07-02 DIAGNOSIS — J4530 Mild persistent asthma, uncomplicated: Secondary | ICD-10-CM | POA: Diagnosis not present

## 2021-07-02 DIAGNOSIS — J301 Allergic rhinitis due to pollen: Secondary | ICD-10-CM | POA: Diagnosis not present

## 2021-07-04 DIAGNOSIS — F411 Generalized anxiety disorder: Secondary | ICD-10-CM | POA: Diagnosis not present

## 2021-08-04 DIAGNOSIS — F411 Generalized anxiety disorder: Secondary | ICD-10-CM | POA: Diagnosis not present

## 2021-08-12 DIAGNOSIS — F411 Generalized anxiety disorder: Secondary | ICD-10-CM | POA: Diagnosis not present

## 2021-09-04 DIAGNOSIS — F411 Generalized anxiety disorder: Secondary | ICD-10-CM | POA: Diagnosis not present

## 2021-10-03 DIAGNOSIS — F411 Generalized anxiety disorder: Secondary | ICD-10-CM | POA: Diagnosis not present

## 2021-11-11 DIAGNOSIS — F411 Generalized anxiety disorder: Secondary | ICD-10-CM | POA: Diagnosis not present

## 2021-11-18 DIAGNOSIS — Z113 Encounter for screening for infections with a predominantly sexual mode of transmission: Secondary | ICD-10-CM | POA: Diagnosis not present

## 2021-11-18 DIAGNOSIS — Z3040 Encounter for surveillance of contraceptives, unspecified: Secondary | ICD-10-CM | POA: Diagnosis not present

## 2021-11-18 DIAGNOSIS — Z6825 Body mass index (BMI) 25.0-25.9, adult: Secondary | ICD-10-CM | POA: Diagnosis not present

## 2021-11-18 DIAGNOSIS — Z01411 Encounter for gynecological examination (general) (routine) with abnormal findings: Secondary | ICD-10-CM | POA: Diagnosis not present

## 2021-12-11 DIAGNOSIS — F411 Generalized anxiety disorder: Secondary | ICD-10-CM | POA: Diagnosis not present

## 2021-12-25 DIAGNOSIS — F411 Generalized anxiety disorder: Secondary | ICD-10-CM | POA: Diagnosis not present

## 2022-02-18 DIAGNOSIS — F411 Generalized anxiety disorder: Secondary | ICD-10-CM | POA: Diagnosis not present

## 2022-04-24 DIAGNOSIS — F411 Generalized anxiety disorder: Secondary | ICD-10-CM | POA: Diagnosis not present

## 2022-04-24 DIAGNOSIS — F429 Obsessive-compulsive disorder, unspecified: Secondary | ICD-10-CM | POA: Diagnosis not present

## 2022-10-23 DIAGNOSIS — F411 Generalized anxiety disorder: Secondary | ICD-10-CM | POA: Diagnosis not present

## 2022-10-23 DIAGNOSIS — F429 Obsessive-compulsive disorder, unspecified: Secondary | ICD-10-CM | POA: Diagnosis not present

## 2022-10-23 DIAGNOSIS — R41840 Attention and concentration deficit: Secondary | ICD-10-CM | POA: Diagnosis not present

## 2022-11-23 DIAGNOSIS — L309 Dermatitis, unspecified: Secondary | ICD-10-CM | POA: Diagnosis not present

## 2022-11-23 DIAGNOSIS — J4530 Mild persistent asthma, uncomplicated: Secondary | ICD-10-CM | POA: Diagnosis not present

## 2022-11-23 DIAGNOSIS — J3089 Other allergic rhinitis: Secondary | ICD-10-CM | POA: Diagnosis not present

## 2022-11-23 DIAGNOSIS — J301 Allergic rhinitis due to pollen: Secondary | ICD-10-CM | POA: Diagnosis not present

## 2023-04-28 DIAGNOSIS — Z111 Encounter for screening for respiratory tuberculosis: Secondary | ICD-10-CM | POA: Diagnosis not present

## 2023-06-09 DIAGNOSIS — D509 Iron deficiency anemia, unspecified: Secondary | ICD-10-CM | POA: Diagnosis not present

## 2023-06-09 DIAGNOSIS — Z3009 Encounter for other general counseling and advice on contraception: Secondary | ICD-10-CM | POA: Diagnosis not present

## 2023-06-09 DIAGNOSIS — F429 Obsessive-compulsive disorder, unspecified: Secondary | ICD-10-CM | POA: Diagnosis not present

## 2023-06-09 DIAGNOSIS — E559 Vitamin D deficiency, unspecified: Secondary | ICD-10-CM | POA: Diagnosis not present

## 2023-06-09 DIAGNOSIS — Z113 Encounter for screening for infections with a predominantly sexual mode of transmission: Secondary | ICD-10-CM | POA: Diagnosis not present

## 2023-07-20 DIAGNOSIS — F419 Anxiety disorder, unspecified: Secondary | ICD-10-CM | POA: Diagnosis not present

## 2023-07-20 DIAGNOSIS — F429 Obsessive-compulsive disorder, unspecified: Secondary | ICD-10-CM | POA: Diagnosis not present

## 2023-08-02 DIAGNOSIS — F411 Generalized anxiety disorder: Secondary | ICD-10-CM | POA: Diagnosis not present

## 2023-08-02 DIAGNOSIS — F429 Obsessive-compulsive disorder, unspecified: Secondary | ICD-10-CM | POA: Diagnosis not present

## 2023-10-18 DIAGNOSIS — R21 Rash and other nonspecific skin eruption: Secondary | ICD-10-CM | POA: Diagnosis not present

## 2023-10-20 DIAGNOSIS — F429 Obsessive-compulsive disorder, unspecified: Secondary | ICD-10-CM | POA: Diagnosis not present

## 2023-10-20 DIAGNOSIS — F411 Generalized anxiety disorder: Secondary | ICD-10-CM | POA: Diagnosis not present

## 2023-11-11 DIAGNOSIS — Z23 Encounter for immunization: Secondary | ICD-10-CM | POA: Diagnosis not present

## 2023-11-12 DIAGNOSIS — F429 Obsessive-compulsive disorder, unspecified: Secondary | ICD-10-CM | POA: Diagnosis not present

## 2023-11-12 DIAGNOSIS — F411 Generalized anxiety disorder: Secondary | ICD-10-CM | POA: Diagnosis not present

## 2023-11-12 DIAGNOSIS — F17290 Nicotine dependence, other tobacco product, uncomplicated: Secondary | ICD-10-CM | POA: Diagnosis not present

## 2023-12-03 DIAGNOSIS — J4530 Mild persistent asthma, uncomplicated: Secondary | ICD-10-CM | POA: Diagnosis not present

## 2023-12-03 DIAGNOSIS — L309 Dermatitis, unspecified: Secondary | ICD-10-CM | POA: Diagnosis not present

## 2023-12-03 DIAGNOSIS — J3089 Other allergic rhinitis: Secondary | ICD-10-CM | POA: Diagnosis not present

## 2023-12-03 DIAGNOSIS — J301 Allergic rhinitis due to pollen: Secondary | ICD-10-CM | POA: Diagnosis not present

## 2024-03-11 DIAGNOSIS — R82998 Other abnormal findings in urine: Secondary | ICD-10-CM | POA: Diagnosis not present

## 2024-03-11 DIAGNOSIS — R109 Unspecified abdominal pain: Secondary | ICD-10-CM | POA: Diagnosis not present

## 2024-03-12 ENCOUNTER — Ambulatory Visit (HOSPITAL_COMMUNITY): Admission: EM | Admit: 2024-03-12 | Discharge: 2024-03-12 | Disposition: A | Discharge status: Home / Self Care

## 2024-03-12 ENCOUNTER — Encounter (HOSPITAL_COMMUNITY): Payer: Self-pay

## 2024-03-12 DIAGNOSIS — R1900 Intra-abdominal and pelvic swelling, mass and lump, unspecified site: Secondary | ICD-10-CM

## 2024-03-12 NOTE — Discharge Instructions (Addendum)
 Suprapubic abdominal swelling of unknown cause but reassuringly you are not having severe pain, nausea, vomiting, constipation, diarrhea, urinary symptoms or irregular periods.  This swelling could be associated with utilizing a preworkout that has stimulants and other ingredients that could cause intestinal issues or this could be associated with retention of fluid secondary to some dehydration.  Given that there are no other concerning symptoms, recommend discontinuing stimulant preworkout's and drinking plenty of water and giving this some more time to allow the body to readjust.  If this persist or your symptoms worsen at all then recommend going to the emergency room where advanced imaging can be performed.

## 2024-03-12 NOTE — ED Provider Notes (Signed)
 [facility]    CSN: [id number] Arrival date & time: 03/12/24  1724      History   Chief Complaint Chief Complaint  Patient presents with   Abdominal Pain   Bloated    HPI Sheila Woodward is a 23 y.o. female.   23 year old female who presents urgent care with complaints of lower abdomen/suprapubic swelling.  She reports that she started working out and 2 days ago noticed soreness in the suprapubic region in the groins.  She went to urgent care yesterday for the same issue and had a urinalysis done which showed some mild dehydration but no infection.  Pregnancy test was negative as well.  The patient reports that she continues to have symptoms today and was concerned and wanted to have it reevaluated.  She also relates that she has started using a preworkout with a stimulant prior to working out as well as a protein supplement.  She denies nausea, vomiting, irregular periods, dysuria, hematuria, abdominal pain, diarrhea, constipation.  She is not having fevers or chills.   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 No pertinent surgical history.  OB History   No obstetric history on file.      Home Medications    Prior to Admission medications   Medication Sig Start Date End Date Taking? Authorizing Provider  buPROPion (WELLBUTRIN SR) 150 MG 12 hr tablet Take 150 mg by mouth daily.   Yes [provider]  busPIRone (BUSPAR) 5 MG tablet Take 5 mg  by mouth 2 (two) times daily.   Yes [provider]  sertraline (ZOLOFT) 100 MG tablet Take 200 mg by mouth daily.   Yes [provider]  artificial tears (LACRILUBE) OINT ophthalmic ointment Place into both eyes every 4 (four) hours as needed for dry eyes. 07/25/20   Arthor Captain, PA-C  erythromycin ophthalmic ointment Place a 1/2 inch ribbon of ointment into the lower eyelid. 07/25/20   Arthor Captain, PA-C  erythromycin with ethanol (EMGEL) 2 % gel Apply 1 g topically 2 (two) times daily. 10/30/19   [provider]  ESTARYLLA 0.25-35 MG-MCG tablet Take 1 tablet by mouth daily. 09/04/19   [provider]  FLOVENT HFA 44 MCG/ACT inhaler Take 1 puff by mouth 2 (two) times daily. 10/29/19   [provider]  fluticasone Aleda Grana) 50 MCG/ACT nasal spray  05/26/13   [provider]  fluvoxaMINE (LUVOX) 50 MG tablet Take 2 tablets (100 mg total) by mouth 2 (two) times daily. 11/04/19   Starkes-Perry, Juel Burrow, FNP  hydrOXYzine (ATARAX/VISTARIL) 10 MG tablet Take 1 tablet (10 mg total) by mouth 3 (three) times daily as needed for anxiety. 11/04/19   Starkes-Perry, Juel Burrow, FNP  levocetirizine (XYZAL) 5 MG tablet Take 1 tablet by mouth daily. 10/29/19   [provider]  montelukast (SINGULAIR) 10 MG tablet  05/17/13   [provider]    Family History Family History  Problem  Relation Age of Onset   Endometriosis Mother    Endometriosis Maternal Grandmother     Social History Social History   Tobacco Use   Smoking status: Never   Smokeless tobacco: Never  Vaping Use   Vaping status: Every Day   Substances: Nicotine, Flavoring  Substance Use Topics   Alcohol use: Yes   Drug use: No     Allergies   Sulfa antibiotics   Review of Systems Review of Systems  Constitutional:  Negative for chills and fever.  HENT:  Negative for ear pain and sore throat.   Eyes:  Negative for pain and visual disturbance.  Respiratory:   Negative for cough and shortness of breath.   Cardiovascular:  Negative for chest pain and palpitations.  Gastrointestinal:  Positive for abdominal distention. Negative for abdominal pain, constipation, diarrhea, nausea and vomiting.  Genitourinary:  Negative for dysuria and hematuria.  Musculoskeletal:  Negative for arthralgias and back pain.  Skin:  Negative for color change and rash.  Neurological:  Negative for seizures and syncope.  All other systems reviewed and are negative.    Physical Exam Triage Vital Signs ED Triage Vitals  Encounter Vitals Group     BP 03/12/24 1807 112/76     Systolic BP Percentile --      Diastolic BP Percentile --      Pulse Rate 03/12/24 1807 (!) 104     Resp 03/12/24 1807 14     Temp 03/12/24 1807 98.1 F (36.7 C)     Temp Source 03/12/24 1807 Oral     SpO2 03/12/24 1807 95 %     Weight --      Height --      Head Circumference --      Peak Flow --      Pain Score 03/12/24 1806 6     Pain Loc --      Pain Education --      Exclude from Growth Chart --    No data found.  Updated Vital Signs BP 112/76 (BP Location: Left Arm)   Pulse (!) 104   Temp 98.1 F (36.7 C) (Oral)   Resp 14   LMP 03/08/2024 (Approximate)   SpO2 95%   Visual Acuity Right Eye Distance:   Left Eye Distance:   Bilateral Distance:    Right Eye Near:   Left Eye Near:    Bilateral Near:     Physical Exam Vitals and nursing note reviewed.  Constitutional:      General: She is not in acute distress.    Appearance: She is well-developed.  HENT:     Head: Normocephalic and atraumatic.  Eyes:     Conjunctiva/sclera: Conjunctivae normal.  Cardiovascular:     Rate and Rhythm: Normal rate and regular rhythm.     Heart sounds: No murmur heard. Pulmonary:     Effort: Pulmonary effort is normal. No respiratory distress.     Breath sounds: Normal breath sounds.  Abdominal:     General: Abdomen is flat. Bowel sounds are normal.     Palpations: Abdomen is soft.  There is no shifting dullness or hepatomegaly.     Tenderness: There is no abdominal tenderness. There is no guarding or rebound. Negative signs include Murphy's sign.     Comments: Mild suprapubic swelling, nontender, bowel sounds are present  Musculoskeletal:        General: No swelling.     Cervical back: Neck supple.  Skin:    General:  Skin is warm and dry.     Capillary Refill: Capillary refill takes less than 2 seconds.  Neurological:     Mental Status: She is alert.  Psychiatric:        Mood and Affect: Mood normal.      UC Treatments / Results  Labs (all labs ordered are listed, but only abnormal results are displayed) Labs Reviewed - No data to display  EKG   Radiology No results found.  Procedures Procedures (including critical care time)  Medications Ordered in UC Medications - No data to display  Initial Impression / Assessment and Plan / UC Course  I have reviewed the triage vital signs and the nursing notes.  Pertinent labs & imaging results that were available during my care of the patient were reviewed by me and considered in my medical decision making (see chart for details).     Abdominal wall swelling   Suprapubic abdominal swelling of unknown cause but reassuringly you are not having severe pain, nausea, vomiting, constipation, diarrhea, urinary symptoms or irregular periods.  This swelling could be associated with utilizing a preworkout that has stimulants and other ingredients that could cause intestinal issues or this could be associated with retention of fluid secondary to some dehydration.  Given that there are no other concerning symptoms, recommend discontinuing stimulant preworkout's and drinking plenty of water and giving this some more time to allow the body to readjust.  If this persist or your symptoms worsen at all then recommend going to the emergency room where advanced imaging can be performed.  Final Clinical Impressions(s) / UC  Diagnoses   Final diagnoses:  Abdominal wall swelling     Discharge Instructions      Suprapubic abdominal swelling of unknown cause but reassuringly you are not having severe pain, nausea, vomiting, constipation, diarrhea, urinary symptoms or irregular periods.  This swelling could be associated with utilizing a preworkout that has stimulants and other ingredients that could cause intestinal issues or this could be associated with retention of fluid secondary to some dehydration.  Given that there are no other concerning symptoms, recommend discontinuing stimulant preworkout's and drinking plenty of water and giving this some more time to allow the body to readjust.  If this persist or your symptoms worsen at all then recommend going to the emergency room where advanced imaging can be performed.   ED Prescriptions   None    PDMP not reviewed this encounter.   Quintella Reichert 03/12/24 1835

## 2024-03-12 NOTE — ED Triage Notes (Signed)
 Patient c/o abdominal tenderness and bilateral groin area x 2 days. Patient states the pain has decreased and when it did she has swelling in the lower abdomen. Patient states she has been working out and feels like she has fluid buildup.
